# Patient Record
Sex: Female | Born: 1960 | Hispanic: Yes | Marital: Single | Smoking: Current every day smoker
Health system: Southern US, Community
[De-identification: ages and names within clinical notes are randomized; demographics above are authoritative.]

## PROBLEM LIST (undated history)

## (undated) HISTORY — PX: APPENDECTOMY: SHX54

---

## 2013-05-22 ENCOUNTER — Ambulatory Visit: Payer: Self-pay | Admitting: Family Medicine

## 2014-07-15 ENCOUNTER — Ambulatory Visit: Payer: Self-pay

## 2016-04-23 ENCOUNTER — Encounter: Payer: Self-pay | Admitting: Emergency Medicine

## 2016-04-23 ENCOUNTER — Emergency Department: Payer: Self-pay

## 2016-04-23 ENCOUNTER — Emergency Department
Admission: EM | Admit: 2016-04-23 | Discharge: 2016-04-23 | Disposition: A | Discharge status: Home / Self Care | Payer: Self-pay | Attending: Emergency Medicine | Admitting: Emergency Medicine

## 2016-04-23 DIAGNOSIS — F17210 Nicotine dependence, cigarettes, uncomplicated: Secondary | ICD-10-CM | POA: Insufficient documentation

## 2016-04-23 DIAGNOSIS — R0602 Shortness of breath: Secondary | ICD-10-CM | POA: Insufficient documentation

## 2016-04-23 DIAGNOSIS — R0789 Other chest pain: Secondary | ICD-10-CM | POA: Insufficient documentation

## 2016-04-23 DIAGNOSIS — F410 Panic disorder [episodic paroxysmal anxiety] without agoraphobia: Secondary | ICD-10-CM

## 2016-04-23 DIAGNOSIS — F419 Anxiety disorder, unspecified: Secondary | ICD-10-CM | POA: Insufficient documentation

## 2016-04-23 LAB — COMPREHENSIVE METABOLIC PANEL
ALBUMIN: 4.4 g/dL (ref 3.5–5.0)
ALK PHOS: 85 U/L (ref 38–126)
ALT: 24 U/L (ref 14–54)
AST: 28 U/L (ref 15–41)
Anion gap: 11 (ref 5–15)
BILIRUBIN TOTAL: 0.6 mg/dL (ref 0.3–1.2)
BUN: 12 mg/dL (ref 6–20)
CALCIUM: 9.4 mg/dL (ref 8.9–10.3)
CO2: 24 mmol/L (ref 22–32)
CREATININE: 0.53 mg/dL (ref 0.44–1.00)
Chloride: 103 mmol/L (ref 101–111)
GFR calc Af Amer: >60 mL/min (ref >60)
GFR calc non Af Amer: >60 mL/min (ref >60)
GLUCOSE: 121 mg/dL — AB (ref 65–99)
Potassium: 3.4 mmol/L — ABNORMAL LOW (ref 3.5–5.1)
SODIUM: 138 mmol/L (ref 135–145)
Total Protein: 7.6 g/dL (ref 6.5–8.1)

## 2016-04-23 LAB — CBC WITH DIFFERENTIAL/PLATELET
Basophils Absolute: 0.1 10*3/uL (ref 0–0.1)
Basophils Relative: 1 %
Eosinophils Absolute: 0.4 10*3/uL (ref 0–0.7)
Eosinophils Relative: 4 %
HEMATOCRIT: 46.6 % (ref 35.0–47.0)
HEMOGLOBIN: 16.1 g/dL — AB (ref 12.0–16.0)
LYMPHS ABS: 4.3 10*3/uL — AB (ref 1.0–3.6)
LYMPHS PCT: 51 %
MCH: 31.4 pg (ref 26.0–34.0)
MCHC: 34.6 g/dL (ref 32.0–36.0)
MCV: 90.9 fL (ref 80.0–100.0)
MONOS PCT: 5 %
Monocytes Absolute: 0.4 10*3/uL (ref 0.2–0.9)
NEUTROS ABS: 3.3 10*3/uL (ref 1.4–6.5)
NEUTROS PCT: 39 %
Platelets: 213 10*3/uL (ref 150–440)
RBC: 5.12 MIL/uL (ref 3.80–5.20)
RDW: 12.6 % (ref 11.5–14.5)
WBC: 8.5 10*3/uL (ref 3.6–11.0)

## 2016-04-23 LAB — TROPONIN I: Troponin I: <0.03 ng/mL (ref <0.03)

## 2016-04-23 MED ORDER — MECLIZINE HCL 25 MG PO TABS
25.0000 mg | ORAL_TABLET | Freq: Once | ORAL | Status: AC
  Administered 2016-04-23: 25 mg via ORAL
  Filled 2016-04-23: qty 1

## 2016-04-23 NOTE — ED Notes (Signed)
Pt up to restroom without difficulty.  

## 2016-04-23 NOTE — ED Triage Notes (Signed)
Pt to ED via EMS from work, pt doesn't speak english. Per EMS bystander states pt started having anxiety and hyperventilating at work after being told her friend was getting a divorce. Pt c/o CP and SOB. Per EMS VS stable. Medical interpreter at bedside.

## 2016-04-23 NOTE — ED Notes (Signed)
md in to see pt.  

## 2016-04-23 NOTE — ED Provider Notes (Signed)
[facility] Emergency Department Provider Note  ____________________________________________   First MD Initiated Contact with Patient 04/23/16 2206     (approximate)  I have reviewed the triage vital signs and the nursing notes.   HISTORY  Chief Complaint Panic Attack and Chest Pain  The patient and/or family speak(s) Spanish.  They understand they have the right to the use of a hospital interpreter, however at this time they prefer to speak directly with me in Spanish.  They know that they can ask for an interpreter at any time.   HPI Alyssa Wiley is a 55 y.o. female who denies any chronic medical illness and presents by EMS for evaluation of shortness of breath, generalized muscle aching, and some chest pain.  EMS reports that all the symptoms started after the patient was speaking with a friend and the friend informed her that her friend is getting a divorce.  The patient became very upset and anxious and started hyperventilating and having what appears to be a panic attack.  The patient confirms that this was the case in terms of the onset of symptoms.  She reports that her whole body hurts and that her muscles are tight in her chest and moving up into her head and that she has a headache.  All of her symptoms are severe but she is much calmer now than when she first came into the emergency department and she is relaxed and in no acute distress.  She reports that she had some nausea and some vomiting and was also feeling dizzy and still has some residual dizziness.  All the symptoms began acutely after hearing the bad news.   History reviewed. No pertinent past medical history.  There are no active problems to display for this patient.   Past Surgical History:  Procedure Laterality Date  . APPENDECTOMY      Prior to Admission medications   Not on File    Allergies Review of patient's allergies indicates no known allergies.  No family  history on file.  Social History Social History  Substance Use Topics  . Smoking status: Current Every Day Smoker    Types: Cigarettes  . Smokeless tobacco: Never Used  . Alcohol use No    Review of Systems Constitutional: No fever/chills Eyes: No visual changes. ENT: No sore throat. Cardiovascular: +chest pain. Respiratory: +shortness of breath. Gastrointestinal: No abdominal pain.  +N/V.  No diarrhea.  No constipation. Genitourinary: Negative for dysuria. Musculoskeletal: Negative for back pain.  Muscle aches throughout body Skin: Negative for rash. Neurological: Negative for headaches, focal weakness or numbness.  10-point ROS otherwise negative.  ____________________________________________   PHYSICAL EXAM:  VITAL SIGNS: ED Triage Vitals  Enc Vitals Group     BP 04/23/16 2030 (!) 148/86     Pulse Rate 04/23/16 1947 80     Resp 04/23/16 1947 16     Temp 04/23/16 1950 98.1 F (36.7 C)     Temp Source 04/23/16 1947 Oral     SpO2 04/23/16 2030 98 %     Weight --      Height --      Head Circumference --      Peak Flow --      Pain Score 04/23/16 1948 8     Pain Loc --      Pain Edu? --      Excl. in GC? --     Constitutional: Alert and oriented. Well appearing and in no acute  distress. Eyes: Conjunctivae are normal. PERRL. EOMI. Head: Atraumatic. Nose: No congestion/rhinnorhea. Mouth/Throat: Mucous membranes are moist.  Oropharynx non-erythematous. Neck: No stridor.  No meningeal signs.   Cardiovascular: Normal rate, regular rhythm. Good peripheral circulation. Grossly normal heart sounds. Respiratory: Normal respiratory effort.  No retractions. Lungs CTAB. Gastrointestinal: Soft and nontender. No distention.  Musculoskeletal: No lower extremity tenderness nor edema. No gross deformities of extremities. Neurologic:  Normal speech and language. No gross focal neurologic deficits are appreciated.  Skin:  Skin is warm, dry and intact. No rash  noted. Psychiatric: Mood and affect are normal. Speech and behavior are normal.  ____________________________________________   LABS (all labs ordered are listed, but only abnormal results are displayed)  Labs Reviewed  CBC WITH DIFFERENTIAL/PLATELET - Abnormal; Notable for the following:       Result Value   Hemoglobin 16.1 (*)    Lymphs Abs 4.3 (*)    All other components within normal limits  COMPREHENSIVE METABOLIC PANEL - Abnormal; Notable for the following:    Potassium 3.4 (*)    Glucose, Bld 121 (*)    All other components within normal limits  TROPONIN I   ____________________________________________  EKG  ED ECG REPORT I, Jeanifer Halliday, the attending physician, personally viewed and interpreted this ECG.  Date: 04/23/2016 EKG Time: 19:48 Rate: 81 Rhythm: normal sinus rhythm QRS Axis: normal Intervals: normal ST/T Wave abnormalities: normal Conduction Disturbances: none Narrative Interpretation: unremarkable  ____________________________________________  RADIOLOGY   Dg Chest 2 View  Result Date: 04/23/2016 CLINICAL DATA:  Exactly and hyperventilation. Shortness of breath and chest pain. EXAM: CHEST  2 VIEW COMPARISON:  None. FINDINGS: Lungs are adequately inflated without consolidation or effusion. Cardiomediastinal silhouette is within normal. There are mild degenerative changes of the spine. IMPRESSION: No active cardiopulmonary disease. Electronically Signed   By: Elberta Fortis M.D.   On: 04/23/2016 21:38    ____________________________________________   PROCEDURES  Procedure(s) performed:   Procedures   Critical Care performed: No ____________________________________________   INITIAL IMPRESSION / ASSESSMENT AND PLAN / ED COURSE  Pertinent labs & imaging results that were available during my care of the patient were reviewed by me and considered in my medical decision making (see chart for details).  Patient was very upset by the bad  news from her friend and seems to have had a full-blown panic attack/anxiety attack.  Her workup has been very reassuring in the emergency department.  She complained of some persistent dizziness and I gave her dose of meclizine but she feels better at this time.  Her primary care doctor is Dr. Seward Carol.  I encouraged her to follow up with her primary care doctor at the next available opportunity to discuss her symptoms.  I gave my usual and customary return precautions.      Clinical Course    ____________________________________________  FINAL CLINICAL IMPRESSION(S) / ED DIAGNOSES  Final diagnoses:  Anxiety attack  Atypical chest pain     MEDICATIONS GIVEN DURING THIS VISIT:  Medications  meclizine (ANTIVERT) tablet 25 mg (25 mg Oral Given 04/23/16 2225)     NEW OUTPATIENT MEDICATIONS STARTED DURING THIS VISIT:  New Prescriptions   No medications on file    Modified Medications   No medications on file    Discontinued Medications   No medications on file     Note:  This document was prepared using Dragon voice recognition software and may include unintentional dictation errors.    Loleta Rose, MD 04/23/16 [phone]

## 2018-05-07 IMAGING — CR DG CHEST 2V
1 series · 2 of 2 positions shown · non-contrast
Comparison: None.

CLINICAL DATA: Exactly and hyperventilation. Shortness of breath
and chest pain.

EXAM:
CHEST  2 VIEW

[Series 1: w chest pa · 0.14mm/px · 2 of 2 slices shown]
[im 1/2]
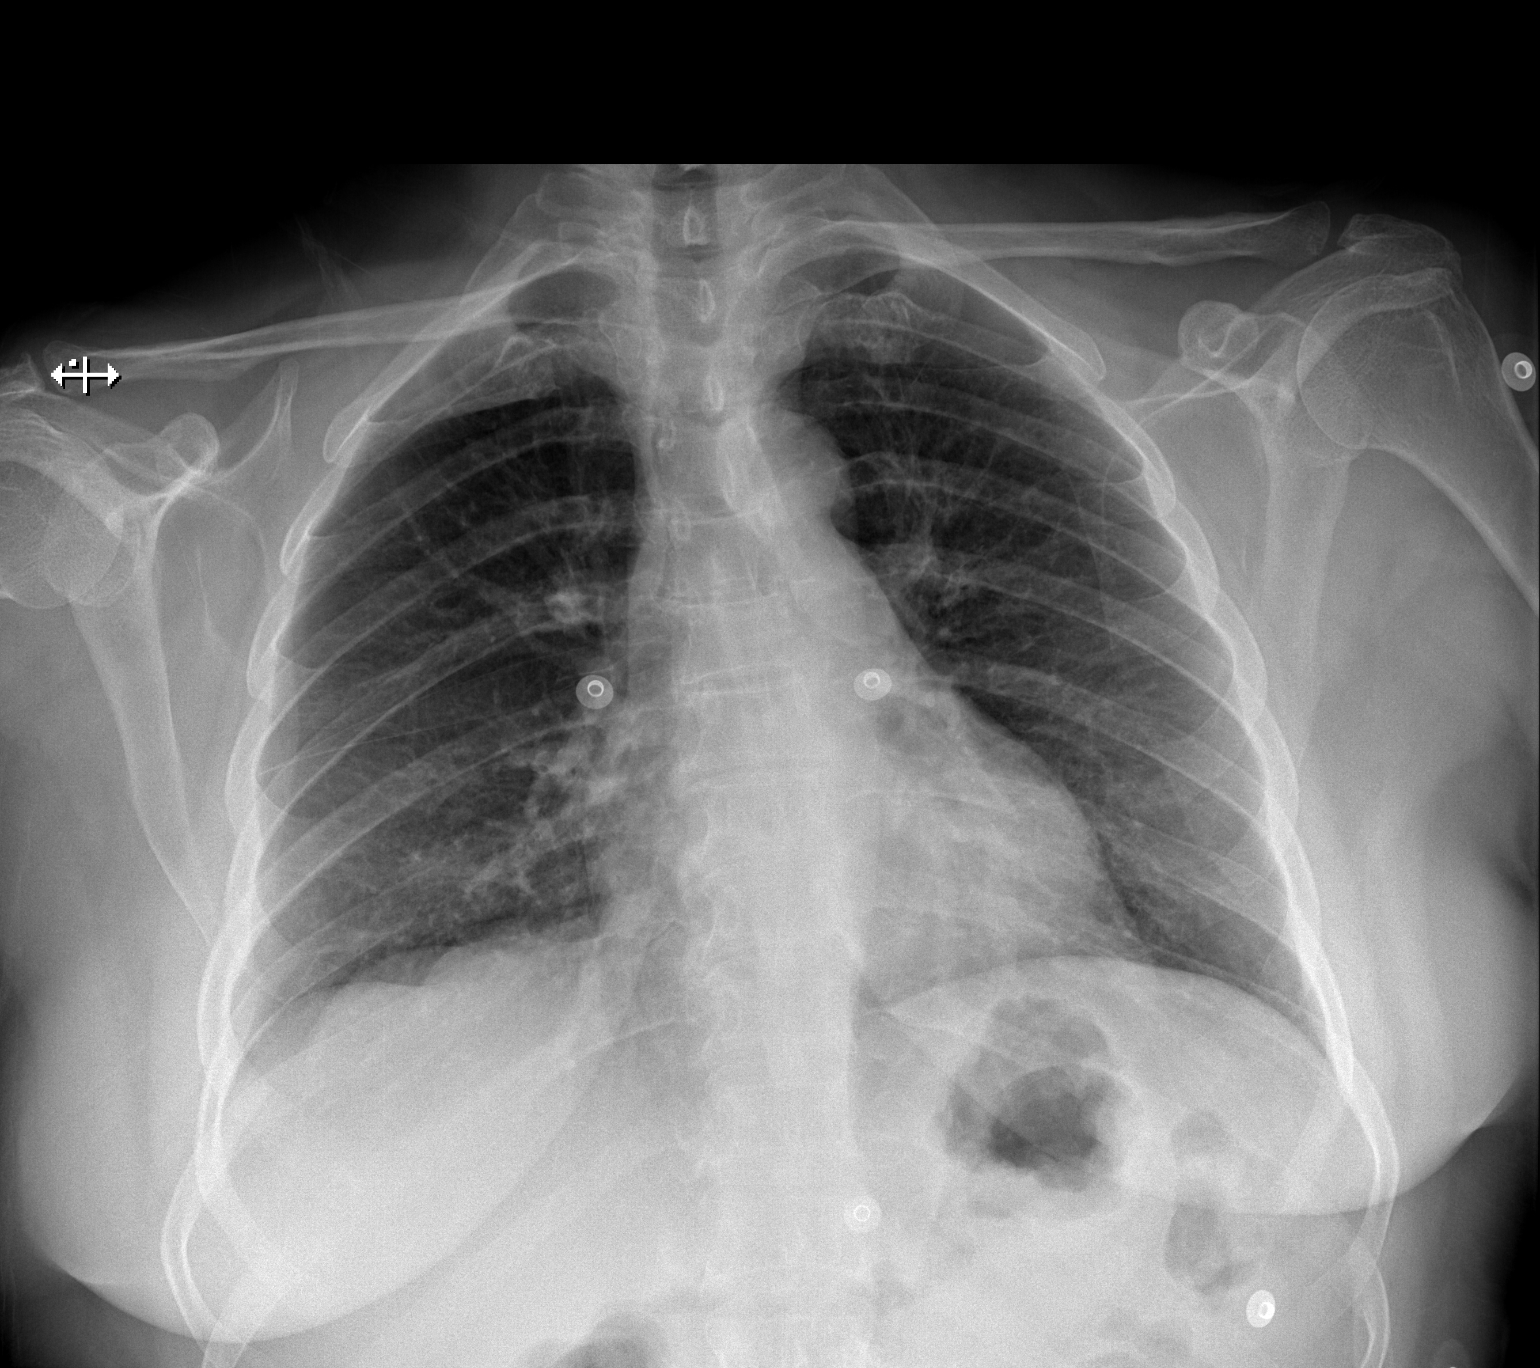
[im 2/2]
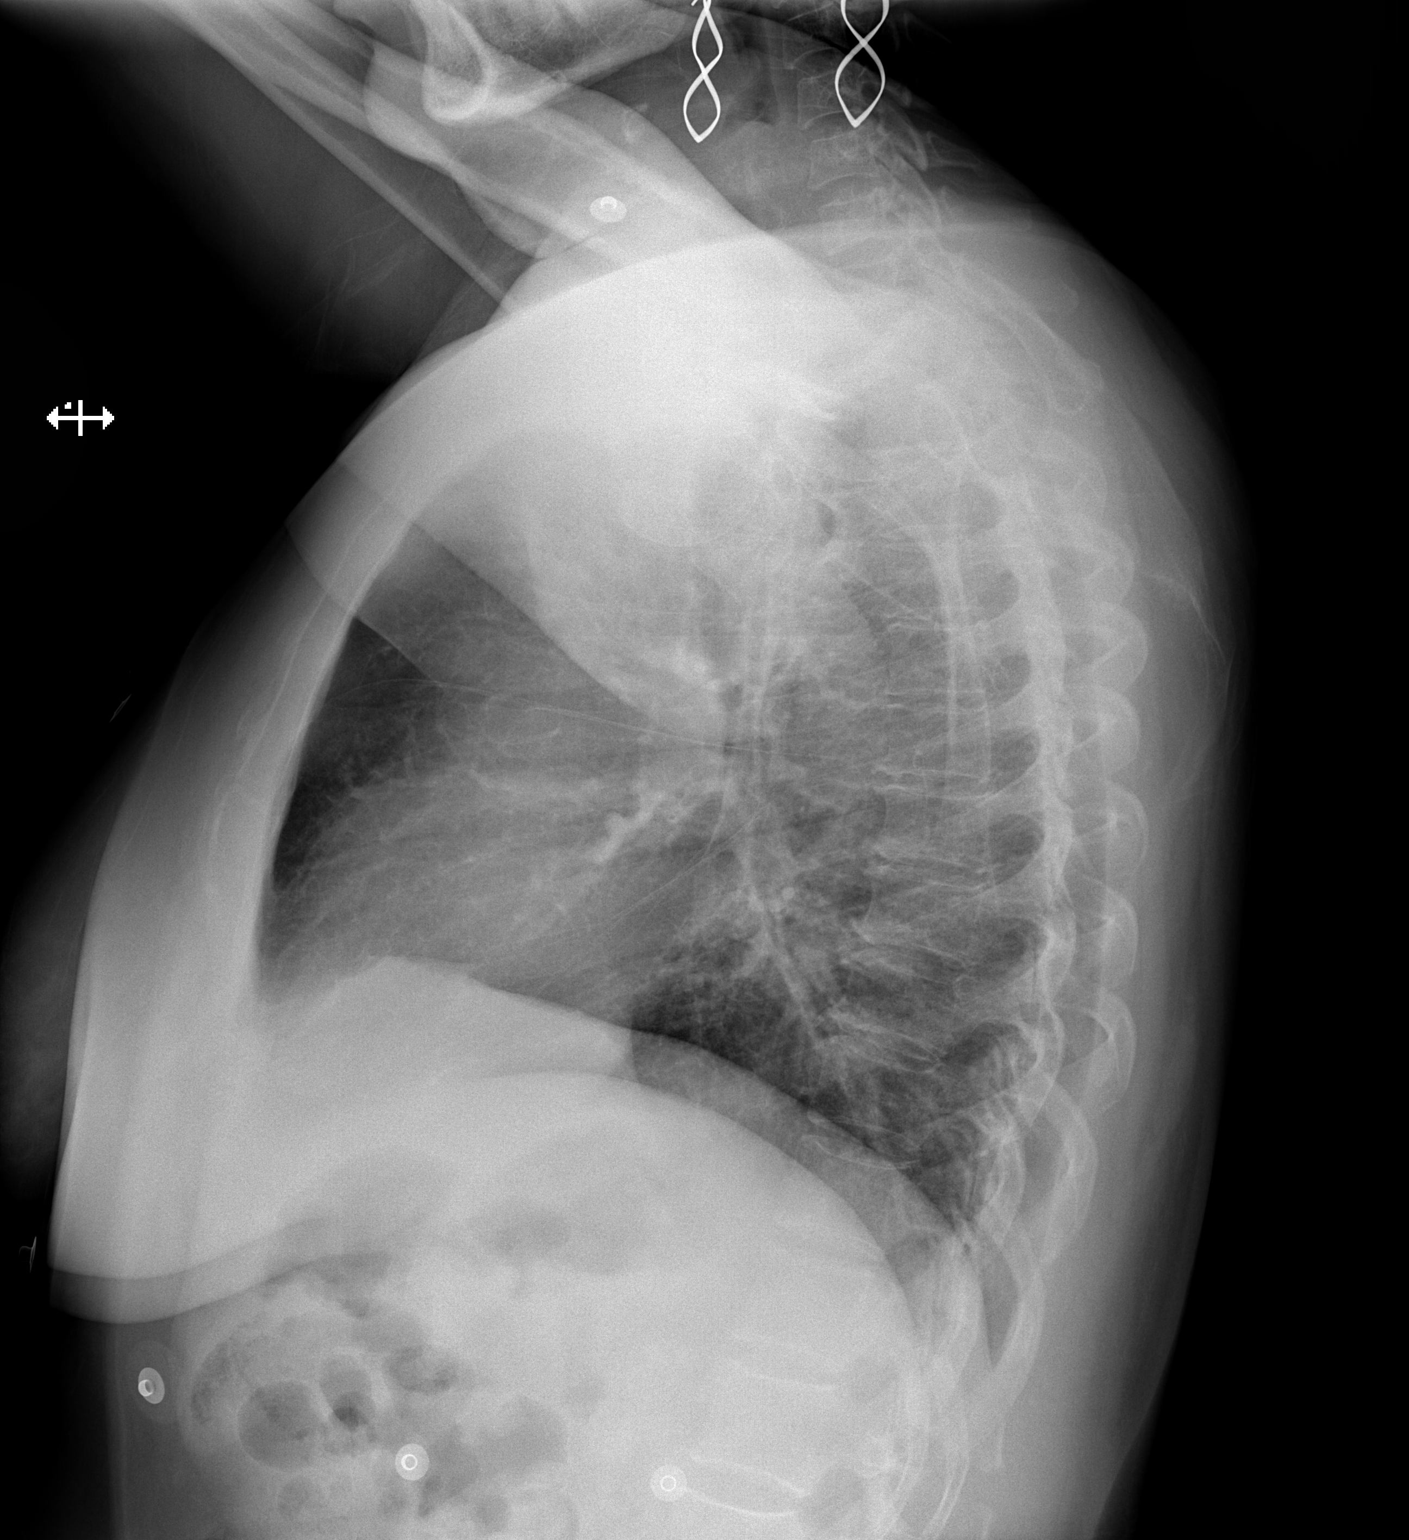

[2 of 2 positions shown; findings below may reference images not displayed]

FINDINGS: Lungs are adequately inflated without consolidation or effusion.
Cardiomediastinal silhouette is within normal. There are mild
degenerative changes of the spine.
IMPRESSION: No active cardiopulmonary disease.

## 2019-12-19 ENCOUNTER — Ambulatory Visit: Payer: Self-pay | Attending: Oncology

## 2024-05-09 ENCOUNTER — Telehealth: Payer: Self-pay
